# Patient Record
Sex: Female | Born: 1985 | Race: Black or African American | Hispanic: No | Marital: Single | Smoking: Never smoker
Health system: Southern US, Community
[De-identification: ages and names within clinical notes are randomized; demographics above are authoritative.]

## PROBLEM LIST (undated history)

## (undated) DIAGNOSIS — F419 Anxiety disorder, unspecified: Secondary | ICD-10-CM

## (undated) DIAGNOSIS — F329 Major depressive disorder, single episode, unspecified: Secondary | ICD-10-CM

## (undated) DIAGNOSIS — B009 Herpesviral infection, unspecified: Secondary | ICD-10-CM

## (undated) DIAGNOSIS — F32A Depression, unspecified: Secondary | ICD-10-CM

## (undated) HISTORY — DX: Depression, unspecified: F32.A

## (undated) HISTORY — PX: INDUCED ABORTION: SHX677

## (undated) HISTORY — DX: Anxiety disorder, unspecified: F41.9

## (undated) HISTORY — PX: APPENDECTOMY: SHX54

## (undated) HISTORY — DX: Herpesviral infection, unspecified: B00.9

---
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
## 2006-01-27 HISTORY — PX: HERNIA MESH REMOVAL: SHX1745

## 2018-12-29 DIAGNOSIS — Z113 Encounter for screening for infections with a predominantly sexual mode of transmission: Secondary | ICD-10-CM | POA: Diagnosis not present

## 2018-12-29 DIAGNOSIS — Z01419 Encounter for gynecological examination (general) (routine) without abnormal findings: Secondary | ICD-10-CM | POA: Diagnosis not present

## 2018-12-29 DIAGNOSIS — N760 Acute vaginitis: Secondary | ICD-10-CM | POA: Diagnosis not present

## 2018-12-29 DIAGNOSIS — Z6836 Body mass index (BMI) 36.0-36.9, adult: Secondary | ICD-10-CM | POA: Diagnosis not present

## 2018-12-30 DIAGNOSIS — Z01419 Encounter for gynecological examination (general) (routine) without abnormal findings: Secondary | ICD-10-CM | POA: Diagnosis not present

## 2019-01-07 DIAGNOSIS — F331 Major depressive disorder, recurrent, moderate: Secondary | ICD-10-CM | POA: Diagnosis not present

## 2019-01-07 DIAGNOSIS — F411 Generalized anxiety disorder: Secondary | ICD-10-CM | POA: Diagnosis not present

## 2019-01-07 DIAGNOSIS — Z6836 Body mass index (BMI) 36.0-36.9, adult: Secondary | ICD-10-CM | POA: Diagnosis not present

## 2019-02-03 ENCOUNTER — Encounter: Payer: Self-pay | Admitting: Neurology

## 2019-02-04 DIAGNOSIS — G4701 Insomnia due to medical condition: Secondary | ICD-10-CM | POA: Diagnosis not present

## 2019-02-04 DIAGNOSIS — F331 Major depressive disorder, recurrent, moderate: Secondary | ICD-10-CM | POA: Diagnosis not present

## 2019-02-04 DIAGNOSIS — Z6836 Body mass index (BMI) 36.0-36.9, adult: Secondary | ICD-10-CM | POA: Diagnosis not present

## 2019-02-04 DIAGNOSIS — F411 Generalized anxiety disorder: Secondary | ICD-10-CM | POA: Diagnosis not present

## 2019-02-07 ENCOUNTER — Ambulatory Visit: Payer: BC Managed Care – PPO | Admitting: Neurology

## 2019-02-07 ENCOUNTER — Other Ambulatory Visit: Payer: Self-pay

## 2019-02-07 ENCOUNTER — Encounter: Payer: Self-pay | Admitting: Neurology

## 2019-02-07 VITALS — BP 146/93 | HR 74 | Temp 97.5°F | Ht 65.0 in | Wt 215.3 lb

## 2019-02-07 DIAGNOSIS — E669 Obesity, unspecified: Secondary | ICD-10-CM

## 2019-02-07 DIAGNOSIS — R0681 Apnea, not elsewhere classified: Secondary | ICD-10-CM | POA: Diagnosis not present

## 2019-02-07 DIAGNOSIS — R400 Somnolence: Secondary | ICD-10-CM

## 2019-02-07 DIAGNOSIS — R0683 Snoring: Secondary | ICD-10-CM

## 2019-02-07 DIAGNOSIS — R351 Nocturia: Secondary | ICD-10-CM

## 2019-02-07 DIAGNOSIS — R519 Headache, unspecified: Secondary | ICD-10-CM

## 2019-02-07 NOTE — Patient Instructions (Signed)
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
## 2019-02-16 ENCOUNTER — Ambulatory Visit (INDEPENDENT_AMBULATORY_CARE_PROVIDER_SITE_OTHER): Payer: BC Managed Care – PPO | Admitting: Neurology

## 2019-02-16 DIAGNOSIS — R400 Somnolence: Secondary | ICD-10-CM

## 2019-02-16 DIAGNOSIS — R0681 Apnea, not elsewhere classified: Secondary | ICD-10-CM

## 2019-02-16 DIAGNOSIS — E669 Obesity, unspecified: Secondary | ICD-10-CM

## 2019-02-16 DIAGNOSIS — R519 Headache, unspecified: Secondary | ICD-10-CM

## 2019-02-16 DIAGNOSIS — G4733 Obstructive sleep apnea (adult) (pediatric): Secondary | ICD-10-CM | POA: Diagnosis not present

## 2019-02-16 DIAGNOSIS — R351 Nocturia: Secondary | ICD-10-CM

## 2019-02-16 DIAGNOSIS — R0683 Snoring: Secondary | ICD-10-CM

## 2019-02-23 ENCOUNTER — Telehealth: Payer: Self-pay

## 2019-02-23 NOTE — Addendum Note (Signed)
Addended by: Huston Foley on: 02/23/2019 08:03 AM   Modules accepted: Orders

## 2019-02-23 NOTE — Telephone Encounter (Signed)
-----   Message from Huston Foley, MD sent at 02/23/2019  8:03 AM EST ----- Patient referred by Dr. Duanne Guess, seen by me on 02/07/19, HST on 02/16/19.    Please call and notify the patient that the recent home sleep test showed obstructive sleep apnea in the moderate range. While I recommend treatment for this in the form CPAP, her insurance will not approve a sleep study for this. They will likely only approve a trial of autoPAP, which means, that we don't have to bring her in for a sleep study with CPAP, but will let her try an autoPAP machine at home, through a DME company (of her choice, or as per insurance requirement). The DME representative will educate her on how to use the machine, how to put the mask on, etc. I have placed an order in the chart. Please send referral, talk to patient, send report to referring MD. We will need a FU in sleep clinic for 10 weeks post-PAP set up, please arrange that with me or one of our NPs. Thanks,   Huston Foley, MD, PhD [facility])

## 2019-02-23 NOTE — Telephone Encounter (Signed)
I reached out to the pt and and we discussed her lab results.  Pt verbalized understanding and is agreeable to starting the Auto pap therapy.  Pt sts she will use [employer] as her DME and will call back if she has any further questions.   Pt was advised I will follow this call up with a letter detailing what we spoke about. Pt was agreeable to this as well.

## 2019-02-23 NOTE — Procedures (Signed)
Patient Information     First Name: Katie Last Name: Gregory Herskowitz: [id number]  Birth Date: [birth year] Age: 34 Gender: Female  Referring Provider: Lewis Moccasin, MD BMI: 36.0 (W=216 lb, H=5' 5'')  Neck Circ.:  16 '' Epworth:  20/24   Sleep Study Information    Study Date: 02/16/2019 S/H/A Version: [id number] / 4.1.1528 / 64  History:    34 year old woman with a history of depression, anxiety, HSV, and obesity, who reports snoring and excessive daytime somnolence, as well as witnessed apneas per boyfriend's report.  Summary & Diagnosis:     OSA Recommendations:     This home sleep test demonstrates moderate obstructive sleep apnea with a total AHI of 22.1/hour and O2 nadir of 85%. Treatment with positive airway pressure (in the form of CPAP) is recommended. This will require a full night CPAP titration study for proper treatment settings, O2 monitoring and mask fitting. Based on the severity of the sleep disordered breathing an attended titration study is indicated. However, patient's insurance has denied an attended sleep study; therefore, the patient will be advised to proceed with an autoPAP titration/trial at home for now. Please note that untreated obstructive sleep apnea may carry additional perioperative morbidity. Patients with significant obstructive sleep apnea should receive perioperative PAP therapy and the surgeons and particularly the anesthesiologist should be informed of the diagnosis and the severity of the sleep disordered breathing. The patient should be cautioned not to drive, work at heights, or operate dangerous or heavy equipment when tired or sleepy. Review and reiteration of good sleep hygiene measures should be pursued with any patient. Other causes of the patient's symptoms, including circadian rhythm disturbances, an underlying mood disorder, medication effect and/or an underlying medical problem cannot be ruled out based on this test. Clinical correlation is recommended.  The patient and her referring provider will be notified of the test results. The patient will be seen in follow up in sleep clinic at [facility].  I certify that I have reviewed the raw data recording prior to the issuance of this report in accordance with the standards of the American Academy of Sleep Medicine (AASM).  Huston Foley, MD, PhD [facility]) Diplomat, ABPN (Neurology and Sleep)                     Sleep Summary  Oxygen Saturation Statistics   Start Study Time: End Study Time: Total Recording Time:          11:58:55 PM 7:04:14 AM         7 h, 5 min  Total Sleep Time % REM of Sleep Time:  6 h, 16 min  26.8    Mean: 96 Minimum: 85 Maximum: 100  Mean of Desaturations Nadirs (%):   93  Oxygen Desaturation. %:   4-9 10-20 >20 Total  Events Number Total    79  2 97.5 2.5  0 0.0  81 100.0  Oxygen Saturation <90 <=88 <85 <80 <70  Duration (minutes): Sleep % 0.4 0.1  0.2 0.0  0.0 0.0 0.0 0.0 0.0 0.0     Respiratory Indices      Total Events REM NREM All Night  pRDI:  152  pAHI:  136 ODI:  81  pAHIc:  0  % CSR: 0.0 51.2 47.0 29.8 0.0 14.7 12.7 6.9 0.0 24.7 22.1 13.2 0.0       Pulse Rate Statistics during Sleep (BPM)      Mean: 92 Minimum:  62 Maximum: 122    Indices are calculated using technically valid sleep time of  6 h, 9 min. Central-Indices are calculated using technically valid sleep time of  5  h, 41 min. pRDI/pAHI are calculated using oxi desaturations ? 3%                Body Position Statistics  Position Supine Prone Right Left Non-Supine  Sleep (min) 64.5 279.3 27.0 6.0 312.3  Sleep % 17.1 74.1 7.2 1.6 82.9  pRDI 24.3 22.0 48.9 N/A 24.8  pAHI 21.5 19.8 44.4 N/A 22.2  ODI 9.3 11.2 40.0 N/A 14.0     Snoring Statistics Snoring Level (dB) >40 >50 >60 >70 >80 >Threshold (45)  Sleep (min) 355.8 6.2 0.7 0.0 0.0 60.1  Sleep % 94.4 1.6 0.2 0.0 0.0 15.9    Mean: 43 dB Sleep Stages  Chart                                  pAHI=22.1                                              Mild              Moderate                    Severe                                                 5              15                    30

## 2019-02-23 NOTE — Progress Notes (Signed)
Patient referred by Dr. Duanne Guess, seen by me on 02/07/19, HST on 02/16/19.    Please call and notify the patient that the recent home sleep test showed obstructive sleep apnea in the moderate range. While I recommend treatment for this in the form CPAP, her insurance will not approve a sleep study for this. They will likely only approve a trial of autoPAP, which means, that we don't have to bring her in for a sleep study with CPAP, but will let her try an autoPAP machine at home, through a DME company (of her choice, or as per insurance requirement). The DME representative will educate her on how to use the machine, how to put the mask on, etc. I have placed an order in the chart. Please send referral, talk to patient, send report to referring MD. We will need a FU in sleep clinic for 10 weeks post-PAP set up, please arrange that with me or one of our NPs. Thanks,   Huston Foley, MD, PhD [facility])

## 2019-03-01 DIAGNOSIS — F4323 Adjustment disorder with mixed anxiety and depressed mood: Secondary | ICD-10-CM | POA: Diagnosis not present

## 2019-03-15 DIAGNOSIS — F4323 Adjustment disorder with mixed anxiety and depressed mood: Secondary | ICD-10-CM | POA: Diagnosis not present

## 2019-03-16 DIAGNOSIS — F411 Generalized anxiety disorder: Secondary | ICD-10-CM | POA: Diagnosis not present

## 2019-03-16 DIAGNOSIS — F331 Major depressive disorder, recurrent, moderate: Secondary | ICD-10-CM | POA: Diagnosis not present

## 2019-03-16 DIAGNOSIS — G4701 Insomnia due to medical condition: Secondary | ICD-10-CM | POA: Diagnosis not present

## 2019-05-23 ENCOUNTER — Telehealth: Payer: Self-pay

## 2019-05-23 NOTE — Telephone Encounter (Signed)
Attempted to call pt about cpap

## 2019-05-24 ENCOUNTER — Ambulatory Visit: Payer: Self-pay | Admitting: Adult Health

## 2019-05-24 ENCOUNTER — Encounter: Payer: Self-pay | Admitting: Adult Health

## 2019-08-24 ENCOUNTER — Telehealth: Payer: Self-pay

## 2019-08-24 NOTE — Telephone Encounter (Signed)
Pt called front office regarding information about spotting after having a home pregnancy test. Clinical staff called patient back. Pt states she has been having irregular spotting for a few months. Reports light period in June (11-18) and dark spotting starting June 16th. Pt endorses abdominal cramps, rates 2/10 pain. Pt reports taking a home UPT with positive result 2 days prior. Encouraged pt to go to the MAU for assessment. Pt states she cannot go until after work at [employer]. Encouraged pt to go as soon as possible. Stressed the importance of being evaluated immediately if bleeding becomes heavier or pain increases. Pt verbalized understanding.

## 2019-08-25 ENCOUNTER — Encounter (HOSPITAL_COMMUNITY): Payer: Self-pay | Admitting: Obstetrics and Gynecology

## 2019-08-25 ENCOUNTER — Inpatient Hospital Stay (HOSPITAL_COMMUNITY): Payer: Medicaid Other

## 2019-08-25 ENCOUNTER — Inpatient Hospital Stay (HOSPITAL_COMMUNITY)
Admission: AD | Admit: 2019-08-25 | Discharge: 2019-08-25 | Disposition: A | Discharge status: Home / Self Care | Payer: Medicaid Other | Attending: Obstetrics and Gynecology | Admitting: Obstetrics and Gynecology

## 2019-08-25 ENCOUNTER — Other Ambulatory Visit: Payer: Self-pay

## 2019-08-25 DIAGNOSIS — O26891 Other specified pregnancy related conditions, first trimester: Secondary | ICD-10-CM | POA: Insufficient documentation

## 2019-08-25 DIAGNOSIS — O4691 Antepartum hemorrhage, unspecified, first trimester: Secondary | ICD-10-CM

## 2019-08-25 DIAGNOSIS — Z3A01 Less than 8 weeks gestation of pregnancy: Secondary | ICD-10-CM | POA: Insufficient documentation

## 2019-08-25 DIAGNOSIS — O26899 Other specified pregnancy related conditions, unspecified trimester: Secondary | ICD-10-CM

## 2019-08-25 DIAGNOSIS — R1033 Periumbilical pain: Secondary | ICD-10-CM | POA: Diagnosis not present

## 2019-08-25 DIAGNOSIS — O26851 Spotting complicating pregnancy, first trimester: Secondary | ICD-10-CM | POA: Diagnosis not present

## 2019-08-25 DIAGNOSIS — Z3490 Encounter for supervision of normal pregnancy, unspecified, unspecified trimester: Secondary | ICD-10-CM

## 2019-08-25 LAB — COMPREHENSIVE METABOLIC PANEL
ALT: 15 U/L (ref 0–44)
AST: 16 U/L (ref 15–41)
Albumin: 3.6 g/dL (ref 3.5–5.0)
Alkaline Phosphatase: 43 U/L (ref 38–126)
Anion gap: 9 (ref 5–15)
BUN: 6 mg/dL (ref 6–20)
CO2: 22 mmol/L (ref 22–32)
Calcium: 9.3 mg/dL (ref 8.9–10.3)
Chloride: 105 mmol/L (ref 98–111)
Creatinine, Ser: 0.69 mg/dL (ref 0.44–1.00)
GFR calc Af Amer: >60 mL/min (ref >60)
GFR calc non Af Amer: >60 mL/min (ref >60)
Glucose, Bld: 103 mg/dL — ABNORMAL HIGH (ref 70–99)
Potassium: 3.9 mmol/L (ref 3.5–5.1)
Sodium: 136 mmol/L (ref 135–145)
Total Bilirubin: 0.4 mg/dL (ref 0.3–1.2)
Total Protein: 7.7 g/dL (ref 6.5–8.1)

## 2019-08-25 LAB — URINALYSIS, ROUTINE W REFLEX MICROSCOPIC
Bilirubin Urine: NEGATIVE
Glucose, UA: NEGATIVE mg/dL
Ketones, ur: NEGATIVE mg/dL
Leukocytes,Ua: NEGATIVE
Nitrite: NEGATIVE
Protein, ur: 30 mg/dL — AB
Specific Gravity, Urine: 1.029 (ref 1.005–1.030)
pH: 5 (ref 5.0–8.0)

## 2019-08-25 LAB — ABO/RH: ABO/RH(D): A POS

## 2019-08-25 LAB — WET PREP, GENITAL
Clue Cells Wet Prep HPF POC: NONE SEEN
Sperm: NONE SEEN
Trich, Wet Prep: NONE SEEN
Yeast Wet Prep HPF POC: NONE SEEN

## 2019-08-25 LAB — CBC
HCT: 36.2 % (ref 36.0–46.0)
Hemoglobin: 11.7 g/dL — ABNORMAL LOW (ref 12.0–15.0)
MCH: 27.5 pg (ref 26.0–34.0)
MCHC: 32.3 g/dL (ref 30.0–36.0)
MCV: 85 fL (ref 80.0–100.0)
Platelets: 260 10*3/uL (ref 150–400)
RBC: 4.26 MIL/uL (ref 3.87–5.11)
RDW: 13.3 % (ref 11.5–15.5)
WBC: 5.4 10*3/uL (ref 4.0–10.5)
nRBC: 0 % (ref 0.0–0.2)

## 2019-08-25 LAB — HCG, QUANTITATIVE, PREGNANCY: hCG, Beta Chain, Quant, S: 70320 m[IU]/mL — ABNORMAL HIGH (ref <5)

## 2019-08-25 LAB — POCT PREGNANCY, URINE: Preg Test, Ur: POSITIVE — AB

## 2019-08-25 NOTE — MAU Note (Signed)
.   Katie Gregory is a 34 y.o. at [redacted]w[redacted]d here in MAU reporting: positive pregnancy test. States she started having dark red vaginal spotting on Sunday. Had lower abdominal cramping then but denies pain now.  LMP: 07/08/19 Onset of complaint: Sunday Pain score: 0 Vitals:   08/25/19 1209  BP: (!) 123/90  Pulse: (!) 106  Resp: 16  Temp: 98.3 F (36.8 C)  SpO2: 100%     FHT: Lab orders placed from triage: UA/UPT

## 2019-08-25 NOTE — MAU Provider Note (Signed)
History     CSN: [id number]  Arrival date and time: 08/25/19 1141  First Provider Initiated Contact with Patient 08/25/19 1220     Chief Complaint  Patient presents with  . Vaginal Bleeding   HPI Katie Gregory is a 34 y.o. G7P0060 at [redacted]w[redacted]d who presents to MAU with chief complaint of intermittent vaginal bleeding and abdominal pain. These are new problems, onset last Sunday. Patient endorses dark red spotting. Her abdominal pain is periumbilical. She denies abdominal tenderness, dysuria, fever or recent illness. She is remote from sexual intercourse.  OB History    Gravida  7   Para  0   Term  0   Preterm  0   AB  6   Living  0     SAB  2   TAB  4   Ectopic      Multiple      Live Births              Past Medical History:  Diagnosis Date  . Anxiety   . Depression   . Herpes simplex type II infection     Past Surgical History:  Procedure Laterality Date  . APPENDECTOMY    . HERNIA MESH REMOVAL  2008  . INDUCED ABORTION     x4    Family History  Problem Relation Age of Onset  . Diabetes Mother   . Heart failure Mother   . Asthma Maternal Grandmother   . Prostate cancer Maternal Grandfather     Social History   Tobacco Use  . Smoking status: Never Smoker  . Smokeless tobacco: Never Used  . Tobacco comment: Tried Hooka   Substance Use Topics  . Alcohol use: Yes    Comment: 2-4 a week  . Drug use: Never    Allergies: No Known Allergies  Medications Prior to Admission  Medication Sig Dispense Refill Last Dose  . TRI-LO-SPRINTEC 0.18/0.215/0.25 MG-25 MCG tab Take 1 tablet by mouth daily.     . valACYclovir (VALTREX) 500 MG tablet Take 500 mg by mouth daily. (Patient not taking: Reported on 08/24/2019)       Review of Systems  Genitourinary: Positive for vaginal bleeding.  All other systems reviewed and are negative.  Physical Exam   Blood pressure (!) 123/90, pulse (!) 106, temperature 98.3 F (36.8 C), resp. rate 16, height 5\' 5"   (1.651 m), weight (!) 95.7 kg, last menstrual period 07/08/2019, SpO2 100 %.  Physical Exam Vitals and nursing note reviewed. Exam conducted with a chaperone present.  Cardiovascular:     Rate and Rhythm: Normal rate.     Pulses: Normal pulses.  Pulmonary:     Effort: Pulmonary effort is normal.  Genitourinary:    Comments: Pelvic exam: External genitalia normal, vaginal walls pink and well rugated, cervix visually closed, no lesions noted. No bleeding noted. Bimanual: cervix 0/thick/posterior, neg CMT, uterus nontender, no adnexal tenderness noted.  Skin:    General: Skin is warm.     Capillary Refill: Capillary refill takes less than 2 seconds.  Neurological:     General: No focal deficit present.     Mental Status: She is alert.    MAU Course  Procedures  Patient Vitals for the past 24 hrs:  BP Temp Pulse Resp SpO2 Height Weight  08/25/19 1353 126/73 -- 83 15 100 % -- --  08/25/19 1209 (!) 123/90 98.3 F (36.8 C) (!) 106 16 100 % 5\' 5"  (1.651 m) (!) 95.7 kg  Orders Placed This Encounter  Procedures  . Wet prep, genital  . US OB Comp Less 14 Wks  . Urinalysis, Routine w reflex microscopic  . CBC  . Comprehensive metabolic panel  . hCG, quantitative, pregnancy  . Pregnancy, urine POC  . ABO/Rh   Results for orders placed or performed during the hospital encounter of 08/25/19 (from the past 24 hour(s))  Pregnancy, urine POC     Status: Abnormal   Collection Time: 08/25/19 12:06 PM  Result Value Ref Range   Preg Test, Ur POSITIVE (A) NEGATIVE  Urinalysis, Routine w reflex microscopic     Status: Abnormal   Collection Time: 08/25/19 12:10 PM  Result Value Ref Range   Color, Urine YELLOW YELLOW   APPearance HAZY (A) CLEAR   Specific Gravity, Urine 1.029 1.005 - 1.030   pH 5.0 5.0 - 8.0   Glucose, UA NEGATIVE NEGATIVE mg/dL   Hgb urine dipstick SMALL (A) NEGATIVE   Bilirubin Urine NEGATIVE NEGATIVE   Ketones, ur NEGATIVE NEGATIVE mg/dL   Protein, ur 30 (A)  NEGATIVE mg/dL   Nitrite NEGATIVE NEGATIVE   Leukocytes,Ua NEGATIVE NEGATIVE   RBC / HPF 0-5 0 - 5 RBC/hpf   WBC, UA 0-5 0 - 5 WBC/hpf   Bacteria, UA RARE (A) NONE SEEN   Squamous Epithelial / LPF 0-5 0 - 5   Mucus PRESENT   CBC     Status: Abnormal   Collection Time: 08/25/19 12:25 PM  Result Value Ref Range   WBC 5.4 4.0 - 10.5 K/uL   RBC 4.26 3.87 - 5.11 MIL/uL   Hemoglobin 11.7 (L) 12.0 - 15.0 g/dL   HCT 60.1 36 - 46 %   MCV 85.0 80.0 - 100.0 fL   MCH 27.5 26.0 - 34.0 pg   MCHC 32.3 30.0 - 36.0 g/dL   RDW 09.3 23.5 - 57.3 %   Platelets 260 150 - 400 K/uL   nRBC 0.0 0.0 - 0.2 %  Comprehensive metabolic panel     Status: Abnormal   Collection Time: 08/25/19 12:25 PM  Result Value Ref Range   Sodium 136 135 - 145 mmol/L   Potassium 3.9 3.5 - 5.1 mmol/L   Chloride 105 98 - 111 mmol/L   CO2 22 22 - 32 mmol/L   Glucose, Bld 103 (H) 70 - 99 mg/dL   BUN 6 6 - 20 mg/dL   Creatinine, Ser 2.20 0.44 - 1.00 mg/dL   Calcium 9.3 8.9 - 25.4 mg/dL   Total Protein 7.7 6.5 - 8.1 g/dL   Albumin 3.6 3.5 - 5.0 g/dL   AST 16 15 - 41 U/L   ALT 15 0 - 44 U/L   Alkaline Phosphatase 43 38 - 126 U/L   Total Bilirubin 0.4 0.3 - 1.2 mg/dL   GFR calc non Af Amer >60 >60 mL/min   GFR calc Af Amer >60 >60 mL/min   Anion gap 9 5 - 15  hCG, quantitative, pregnancy     Status: Abnormal   Collection Time: 08/25/19 12:25 PM  Result Value Ref Range   hCG, Beta Chain, Quant, S 70,320 (H) <5 mIU/mL  ABO/Rh     Status: None   Collection Time: 08/25/19 12:25 PM  Result Value Ref Range   ABO/RH(D) A POS    No rh immune globuloin      NOT A RH IMMUNE GLOBULIN CANDIDATE, PT RH POSITIVE Performed at [facility], [address], [place], [place] [address]   Mellody Drown  prep, genital     Status: Abnormal   Collection Time: 08/25/19 12:35 PM  Result Value Ref Range   Yeast Wet Prep HPF POC NONE SEEN NONE SEEN   Trich, Wet Prep NONE SEEN NONE SEEN   Clue Cells Wet Prep HPF POC NONE SEEN NONE SEEN   WBC,  Wet Prep HPF POC MODERATE (A) NONE SEEN   Sperm NONE SEEN    US OB Comp Less 14 Wks  Result Date: 08/25/2019 CLINICAL DATA:  Spotting in first trimester of pregnancy, LMP 07/08/2019, positive urine pregnancy test EXAM: OBSTETRIC <14 WK ULTRASOUND TECHNIQUE: Transabdominal ultrasound was performed for evaluation of the gestation as well as the maternal uterus and adnexal regions. COMPARISON:  None FINDINGS: Intrauterine gestational sac: Present, single Yolk sac:  Present Embryo:  Present Cardiac Activity: Present Heart Rate: 160 bpm CRL:   13.2 mm   7 w 4 d                  [place] EDC: 04/08/2020 Subchorionic hemorrhage:  None visualized. Maternal uterus/adnexae: LEFT ovary normal size and morphology 3.4 x 1.4 x 2.3 cm. RIGHT ovary normal size and morphology 3.1 x 1.7 x 2.3 cm. No free pelvic fluid or adnexal masses. IMPRESSION: Single live intrauterine gestation at 7 weeks 4 days EGA by crown-rump length. No acute abnormalities. Electronically Signed   By: Ulyses Southward M.D.   On: 08/25/2019 13:35   Assessment and Plan  --34 y.o. G7P0060 with [facility] at [redacted]w[redacted]d --Blood type A POS --Patient declines PNV, list of safe medications in pregnancy --Discharge home in stable condition  Calvert Cantor, CNM 08/25/2019, 2:23 PM

## 2019-08-25 NOTE — Discharge Instructions (Signed)
First Trimester of Pregnancy  The first trimester of pregnancy is from week 1 until the end of week 13 (months 1 through 3). During this time, your baby will begin to develop inside you. At 6-8 weeks, the eyes and face are formed, and the heartbeat can be seen on ultrasound. At the end of 12 weeks, all the baby's organs are formed. Prenatal care is all the medical care you receive before the birth of your baby. Make sure you get good prenatal care and follow all of your doctor's instructions. Follow these instructions at home: Medicines  Take over-the-counter and prescription medicines only as told by your doctor. Some medicines are safe and some medicines are not safe during pregnancy.  Take a prenatal vitamin that contains at least 600 micrograms (mcg) of folic acid.  If you have trouble pooping (constipation), take medicine that will make your stool soft (stool softener) if your doctor approves. Eating and drinking   Eat regular, healthy meals.  Your doctor will tell you the amount of weight gain that is right for you.  Avoid raw meat and uncooked cheese.  If you feel sick to your stomach (nauseous) or throw up (vomit): ? Eat 4 or 5 small meals a day instead of 3 large meals. ? Try eating a few soda crackers. ? Drink liquids between meals instead of during meals.  To prevent constipation: ? Eat foods that are high in fiber, like fresh fruits and vegetables, whole grains, and beans. ? Drink enough fluids to keep your pee (urine) clear or pale yellow. Activity  Exercise only as told by your doctor. Stop exercising if you have cramps or pain in your lower belly (abdomen) or low back.  Do not exercise if it is too hot, too humid, or if you are in a place of great height (high altitude).  Try to avoid standing for long periods of time. Move your legs often if you must stand in one place for a long time.  Avoid heavy lifting.  Wear low-heeled shoes. Sit and stand up  straight.  You can have sex unless your doctor tells you not to. Relieving pain and discomfort  Wear a good support bra if your breasts are sore.  Take warm water baths (sitz baths) to soothe pain or discomfort caused by hemorrhoids. Use hemorrhoid cream if your doctor says it is okay.  Rest with your legs raised if you have leg cramps or low back pain.  If you have puffy, bulging veins (varicose veins) in your legs: ? Wear support hose or compression stockings as told by your doctor. ? Raise (elevate) your feet for 15 minutes, 3-4 times a day. ? Limit salt in your food. Prenatal care  Schedule your prenatal visits by the twelfth week of pregnancy.  Write down your questions. Take them to your prenatal visits.  Keep all your prenatal visits as told by your doctor. This is important. Safety  Wear your seat belt at all times when driving.  Make a list of emergency phone numbers. The list should include numbers for family, friends, the hospital, and police and fire departments. General instructions  Ask your doctor for a referral to a local prenatal class. Begin classes no later than at the start of month 6 of your pregnancy.  Ask for help if you need counseling or if you need help with nutrition. Your doctor can give you advice or tell you where to go for help.  Do not use hot tubs, steam   rooms, or saunas.  Do not douche or use tampons or scented sanitary pads.  Do not cross your legs for long periods of time.  Avoid all herbs and alcohol. Avoid drugs that are not approved by your doctor.  Do not use any tobacco products, including cigarettes, chewing tobacco, and electronic cigarettes. If you need help quitting, ask your doctor. You may get counseling or other support to help you quit.  Avoid cat litter boxes and soil used by cats. These carry germs that can cause birth defects in the baby and can cause a loss of your baby (miscarriage) or stillbirth.  Visit your dentist.  At home, brush your teeth with a soft toothbrush. Be gentle when you floss. Contact a doctor if:  You are dizzy.  You have mild cramps or pressure in your lower belly.  You have a nagging pain in your belly area.  You continue to feel sick to your stomach, you throw up, or you have watery poop (diarrhea).  You have a bad smelling fluid coming from your vagina.  You have pain when you pee (urinate).  You have increased puffiness (swelling) in your face, hands, legs, or ankles. Get help right away if:  You have a fever.  You are leaking fluid from your vagina.  You have spotting or bleeding from your vagina.  You have very bad belly cramping or pain.  You gain or lose weight rapidly.  You throw up blood. It may look like coffee grounds.  You are around people who have German measles, fifth disease, or chickenpox.  You have a very bad headache.  You have shortness of breath.  You have any kind of trauma, such as from a fall or a car accident. Summary  The first trimester of pregnancy is from week 1 until the end of week 13 (months 1 through 3).  To take care of yourself and your unborn baby, you will need to eat healthy meals, take medicines only if your doctor tells you to do so, and do activities that are safe for you and your baby.  Keep all follow-up visits as told by your doctor. This is important as your doctor will have to ensure that your baby is healthy and growing well. This information is not intended to replace advice given to you by your health care provider. Make sure you discuss any questions you have with your health care provider. Document Revised: 05/06/2018 Document Reviewed: 01/22/2016 Elsevier Patient Education  2020 [employer] Inc.  

## 2019-08-26 ENCOUNTER — Telehealth: Payer: Self-pay | Admitting: Advanced Practice Midwife

## 2019-08-26 DIAGNOSIS — O98211 Gonorrhea complicating pregnancy, first trimester: Secondary | ICD-10-CM | POA: Insufficient documentation

## 2019-08-26 LAB — GC/CHLAMYDIA PROBE AMP (~~LOC~~) NOT AT ARMC
Chlamydia: NEGATIVE
Comment: NEGATIVE
Comment: NORMAL
Neisseria Gonorrhea: POSITIVE — AB

## 2019-08-26 NOTE — Telephone Encounter (Signed)
Patient notified of gonorrhea positive result (collected in MAU 08/25/2019). Reviewed risk factors impacting pregnancy, recommendation for treatment, partner treatment, condom use. Provided list of centers offering treatment. Patient verbalized understanding.  Clayton Bibles, MSN, CNM Certified Nurse Midwife, [employer] for [employer], [facility] 08/26/19 2:09 PM

## 2019-09-01 ENCOUNTER — Ambulatory Visit (INDEPENDENT_AMBULATORY_CARE_PROVIDER_SITE_OTHER): Payer: Self-pay | Admitting: Lactation Services

## 2019-09-01 ENCOUNTER — Other Ambulatory Visit: Payer: Self-pay

## 2019-09-01 VITALS — BP 128/86 | HR 73 | Ht 65.0 in | Wt 210.0 lb

## 2019-09-01 DIAGNOSIS — O98211 Gonorrhea complicating pregnancy, first trimester: Secondary | ICD-10-CM

## 2019-09-01 DIAGNOSIS — Z3A01 Less than 8 weeks gestation of pregnancy: Secondary | ICD-10-CM

## 2019-09-01 MED ORDER — CEFTRIAXONE SODIUM 500 MG IJ SOLR
500.0000 mg | Freq: Once | INTRAMUSCULAR | Status: AC
  Administered 2019-09-01: 500 mg via INTRAMUSCULAR

## 2019-09-01 NOTE — Patient Instructions (Addendum)
Transportation [phone]

## 2019-09-01 NOTE — Progress Notes (Addendum)
Katie Gregory for Rocephin Injection.  Injection administered without complication. Patient will return in 3 months for next injection. Patient needs TOC testing in 3 months.   Patient is pregnant. She is not sure she wants to keep the pregnancy. She is aware we do not provide abortions. New OB appointments have not been scheduled. Reviewed with patient that care will be delayed and that if she is planning to keep the appointment, I would recommend she make appointments and cancel if she does not plan to keep the appointments.   PHQ9 12 GAD 7 17- Jamie McMannes notified, will make appt   Patient reports partner has not been treated. Advised abstaining from sexual intercourse until he is treated also.   [facility] transportation called to give patient information. Patient is aware to call Transportation to schedule 24 hours in advance. Patient's first in person visit is 09/28/2019. Patient needs to sign transportation waiver.   Ed Blalock, RN 09/01/2019  3:21 PM

## 2019-09-05 NOTE — Progress Notes (Signed)
Patient was assessed and managed by nursing staff during this encounter. I have reviewed the chart and agree with the documentation and plan. I have also made any necessary editorial changes.  Elmor Kost, MD 09/05/2019 8:51 AM   

## 2019-09-06 NOTE — BH Specialist Note (Signed)
Pt did not arrive to video visit and did not answer the phone ; Left HIPPA-compliant message to call back Asher Muir from [facility] [employer] at [facility] at [phone] (main office) or [phone] (Tamey Wanek's office).  ; left MyChart message for patient.    Integrated Behavioral Health via Telemedicine Video (Caregility) Visit  09/06/2019 Katie Gregory [phone]  [name]

## 2019-09-13 ENCOUNTER — Ambulatory Visit: Payer: Self-pay | Admitting: Clinical

## 2019-09-13 ENCOUNTER — Other Ambulatory Visit: Payer: Self-pay

## 2019-09-13 DIAGNOSIS — Z91199 Patient's noncompliance with other medical treatment and regimen due to unspecified reason: Secondary | ICD-10-CM

## 2019-09-13 DIAGNOSIS — Z5329 Procedure and treatment not carried out because of patient's decision for other reasons: Secondary | ICD-10-CM

## 2019-09-15 ENCOUNTER — Telehealth (INDEPENDENT_AMBULATORY_CARE_PROVIDER_SITE_OTHER): Payer: Self-pay

## 2019-09-15 ENCOUNTER — Other Ambulatory Visit: Payer: Self-pay

## 2019-09-15 DIAGNOSIS — O98211 Gonorrhea complicating pregnancy, first trimester: Secondary | ICD-10-CM

## 2019-09-15 DIAGNOSIS — Z3490 Encounter for supervision of normal pregnancy, unspecified, unspecified trimester: Secondary | ICD-10-CM

## 2019-09-15 DIAGNOSIS — Z3A00 Weeks of gestation of pregnancy not specified: Secondary | ICD-10-CM

## 2019-09-15 DIAGNOSIS — Z8619 Personal history of other infectious and parasitic diseases: Secondary | ICD-10-CM

## 2019-09-15 NOTE — Progress Notes (Signed)
New OB Intake  Visit not completed in full.  I connected with Katie Gregory on 09/15/19 at 10:15 AM EDT by telephone and verified that I am speaking with the correct person using two identifiers. Nurse is located at [facility] and pt is located at place of employment. Pt states she did not realize she had this appt and cannot complete today. Explained we will not be able to reschedule prior to her New OB appt.  Concerns addressed today Reports abdominal cramping 8/10. IUP confirmed by [place] 08/25/19. Reports white discharge with pink tinge a few days ago. BM are normal. No symptoms of UTI. Reviewed with Earlene Plater, MD who gives miscarriage precautions and recommends pt try Tylenol. Reviewed with pt.   Pt recently tested positive for Gonorrhea and was treated on 09/01/19. Reports partner was also treated. Reports no intercourse for 7 days following treatment. Pt scheduled for TOC 3 weeks from treatment date.  First visit review I reviewed new OB appt with pt. I explained we will complete all new OB items at upcoming appt. Pt will be seen by Mart Piggs, MD; encounter routed to appropriate provider.  Marjo Bicker, RN 09/15/2019

## 2019-09-22 ENCOUNTER — Ambulatory Visit: Payer: Medicaid Other

## 2019-09-28 ENCOUNTER — Telehealth: Payer: Self-pay | Admitting: Family Medicine

## 2019-09-28 ENCOUNTER — Encounter: Payer: Self-pay | Admitting: Family Medicine

## 2019-09-28 DIAGNOSIS — Z419 Encounter for procedure for purposes other than remedying health state, unspecified: Secondary | ICD-10-CM | POA: Diagnosis not present

## 2019-09-28 NOTE — Telephone Encounter (Signed)
called and spoke to patient about her missed appointment this morning state she was at work and she will call the office back if she needed to reschedule.

## 2019-10-28 DIAGNOSIS — Z419 Encounter for procedure for purposes other than remedying health state, unspecified: Secondary | ICD-10-CM | POA: Diagnosis not present

## 2019-11-24 ENCOUNTER — Ambulatory Visit: Payer: Self-pay

## 2019-11-28 DIAGNOSIS — Z419 Encounter for procedure for purposes other than remedying health state, unspecified: Secondary | ICD-10-CM | POA: Diagnosis not present

## 2019-12-28 DIAGNOSIS — Z419 Encounter for procedure for purposes other than remedying health state, unspecified: Secondary | ICD-10-CM | POA: Diagnosis not present

## 2020-01-28 DIAGNOSIS — Z419 Encounter for procedure for purposes other than remedying health state, unspecified: Secondary | ICD-10-CM | POA: Diagnosis not present

## 2020-02-28 DIAGNOSIS — Z419 Encounter for procedure for purposes other than remedying health state, unspecified: Secondary | ICD-10-CM | POA: Diagnosis not present

## 2020-03-27 DIAGNOSIS — Z419 Encounter for procedure for purposes other than remedying health state, unspecified: Secondary | ICD-10-CM | POA: Diagnosis not present

## 2020-04-27 DIAGNOSIS — Z419 Encounter for procedure for purposes other than remedying health state, unspecified: Secondary | ICD-10-CM | POA: Diagnosis not present

## 2022-04-05 IMAGING — US US OB COMP LESS 14 WK
1 series · 15 of 28 positions shown · non-contrast
Comparison: None

CLINICAL DATA: Spotting in first trimester of pregnancy, LMP
07/08/2019, positive urine pregnancy test

EXAM:
OBSTETRIC <14 WK ULTRASOUND
TECHNIQUE: Transabdominal ultrasound was performed for evaluation of the
gestation as well as the maternal uterus and adnexal regions.

[Series 1: us ob comp less 14 wk · 15 of 36 slices shown]
[im 1/36]
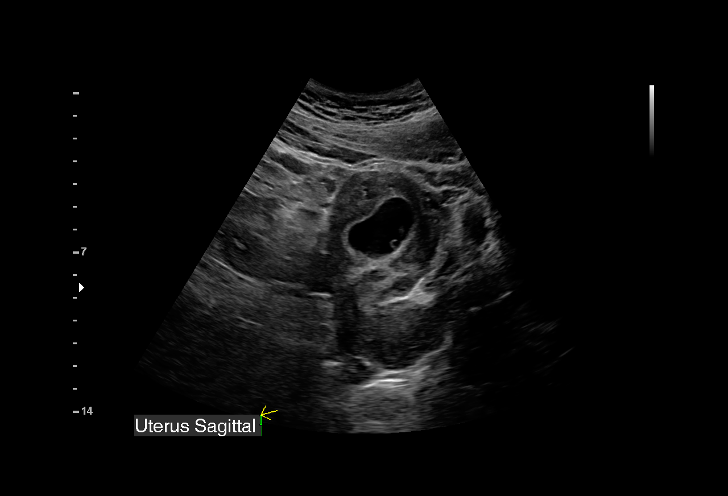
[im 3/36]
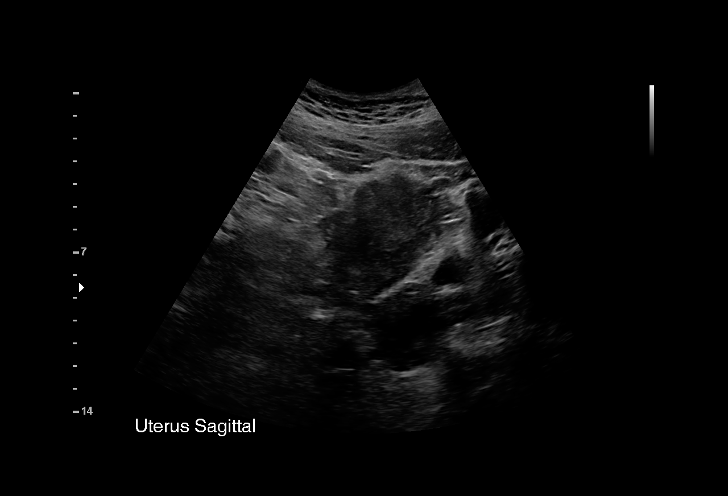
[im 6/36]
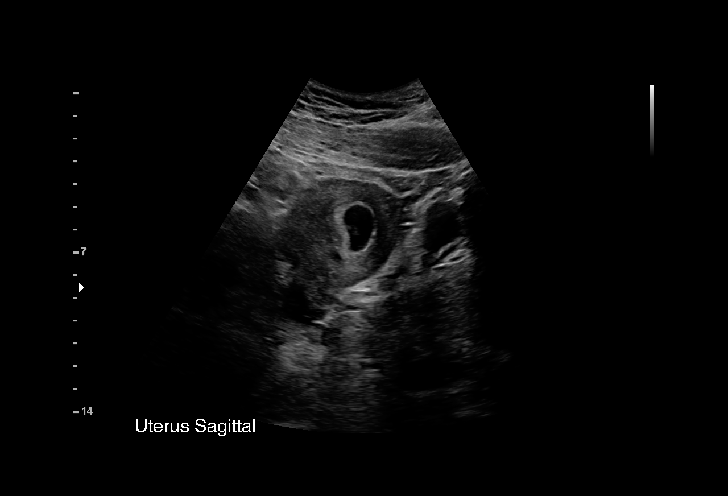
[im 8/36]
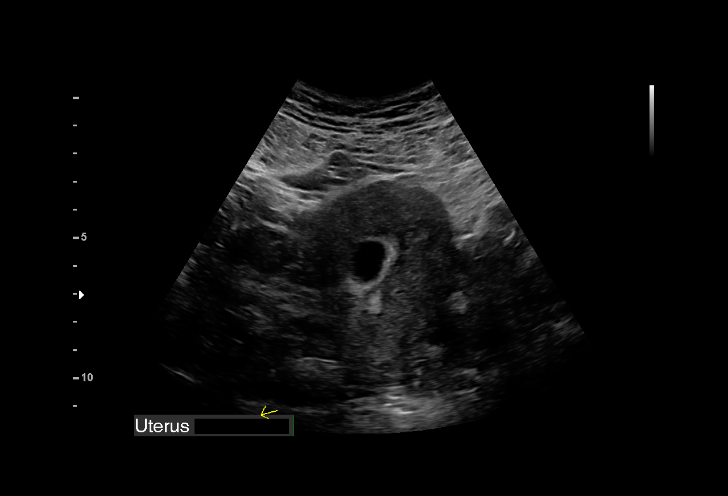
[im 11/36]
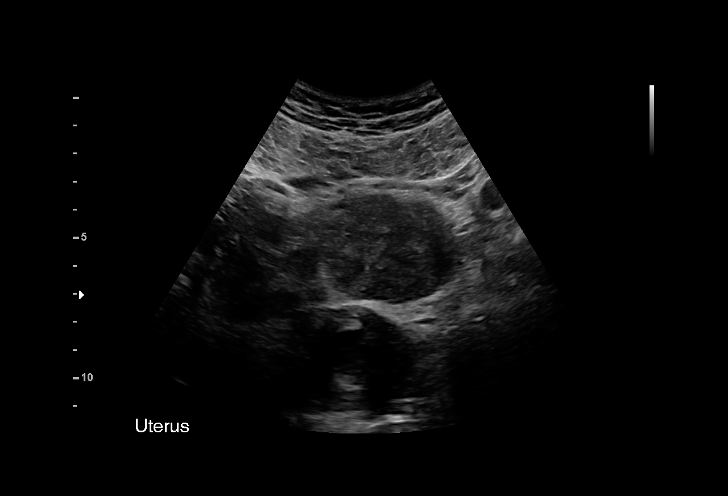
[im 13/36]
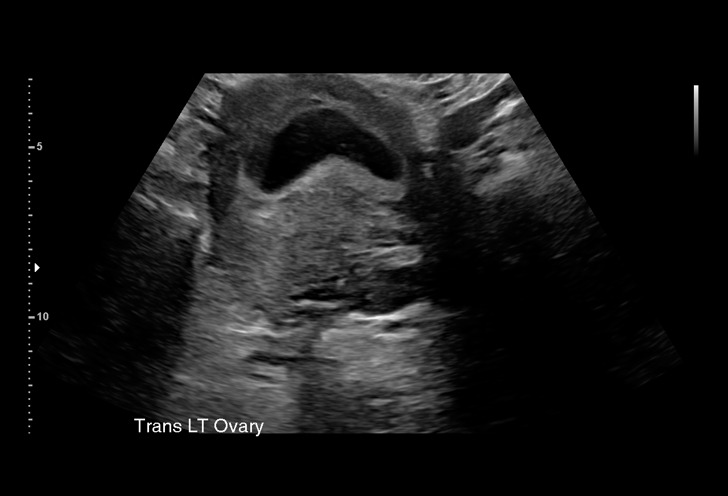
[im 16/36]
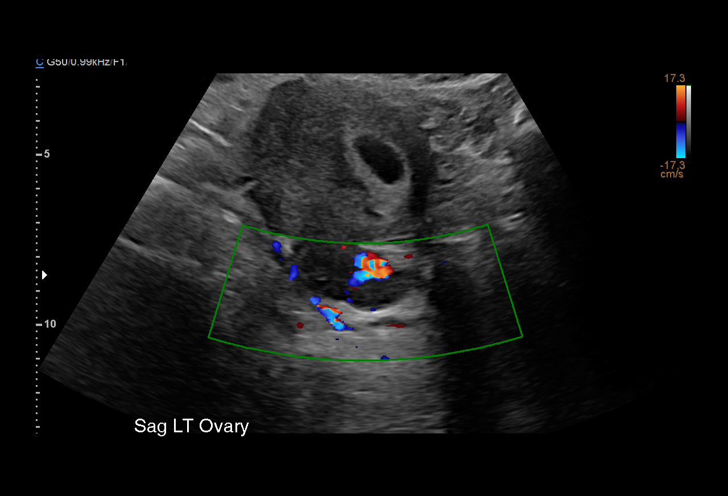
[im 19/36]
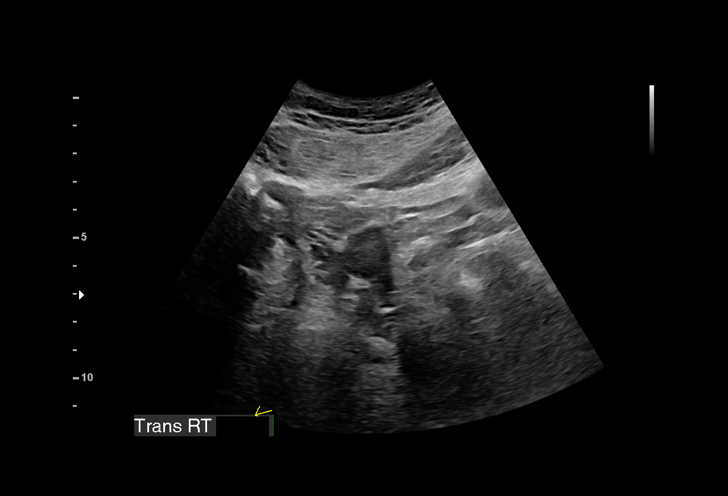
[im 20/36]
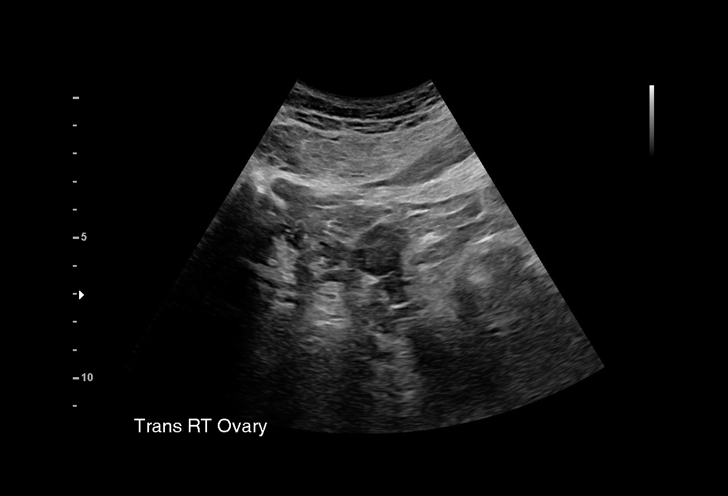
[im 23/36]
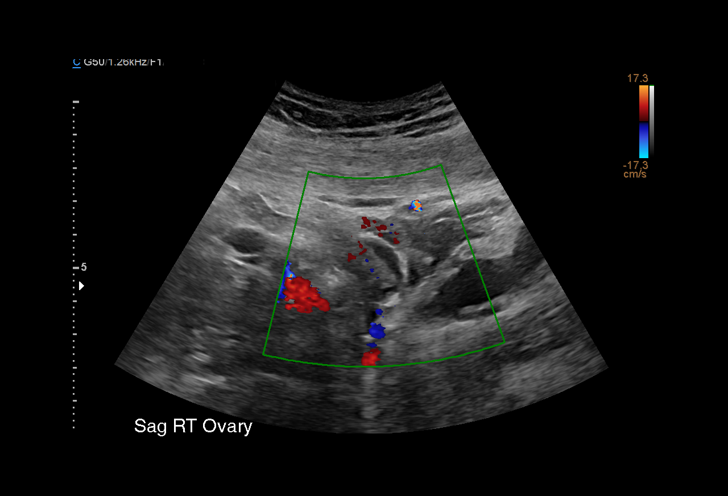
[im 25/36]
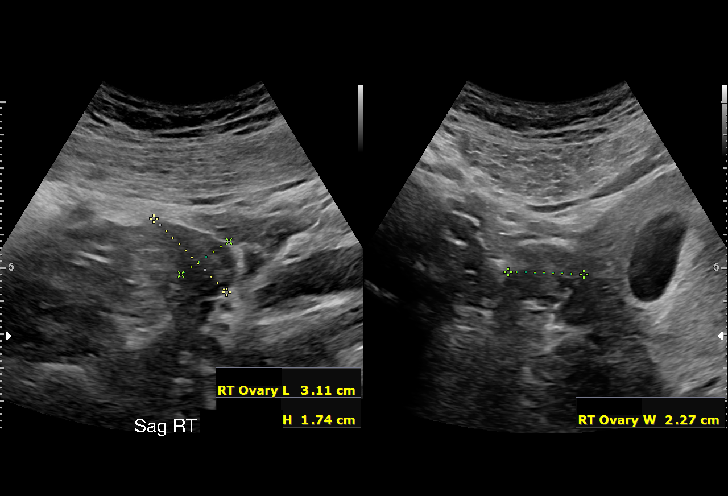
[im 28/36]
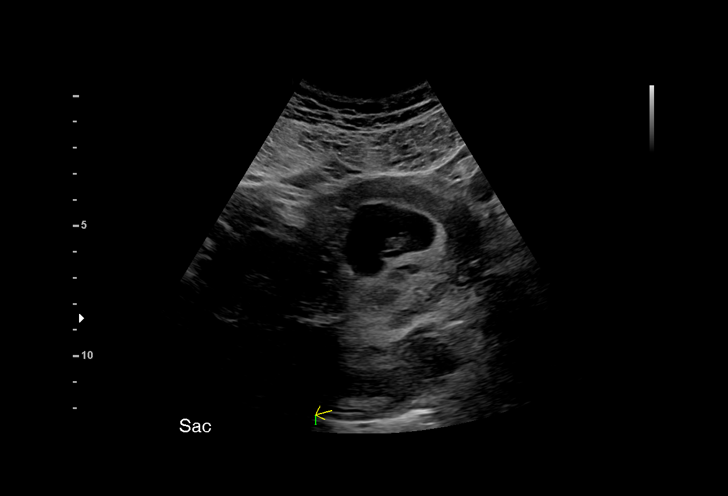
[im 30/36]
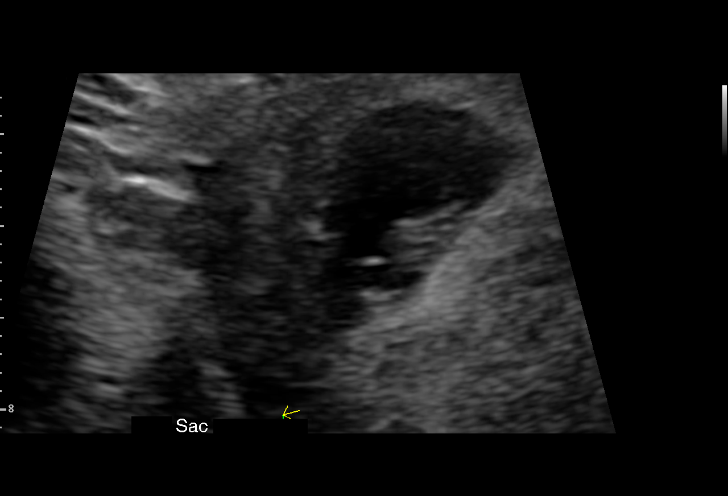
[im 33/36]
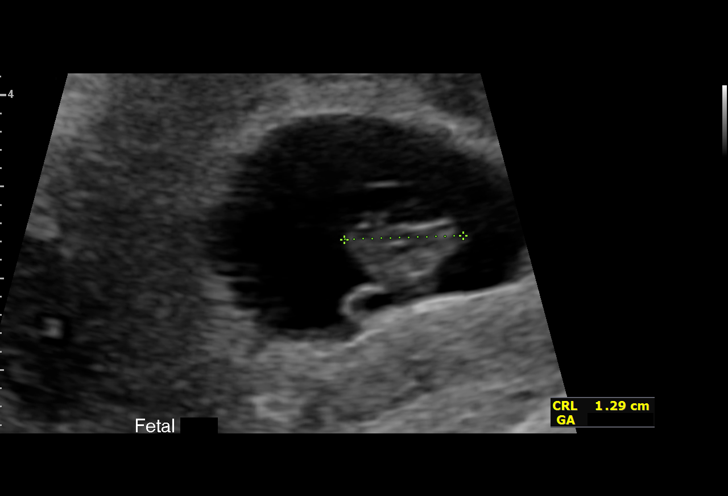
[im 36/36]
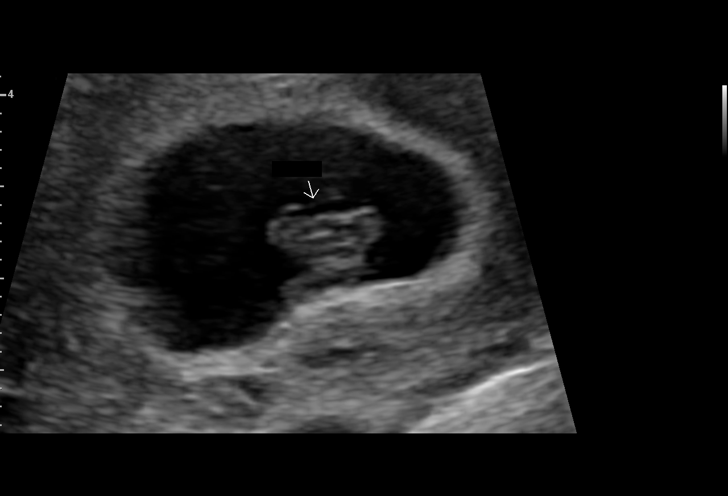

[15 of 28 positions shown; findings below may reference images not displayed]

FINDINGS: Intrauterine gestational sac: Present, single

Yolk sac:  Present

Embryo:  Present

Cardiac Activity: Present

Heart Rate: 160 bpm

CRL:   13.2 mm   7 w 4 d                  US EDC: 04/08/2020

Subchorionic hemorrhage:  None visualized.

Maternal uterus/adnexae:

LEFT ovary normal size and morphology 3.4 x 1.4 x 2.3 cm.

RIGHT ovary normal size and morphology 3.1 x 1.7 x 2.3 cm.

No free pelvic fluid or adnexal masses.
IMPRESSION: Single live intrauterine gestation at 7 weeks 4 days EGA by
crown-rump length.

No acute abnormalities.
